# Patient Record
Sex: Female | Born: 1955 | Hispanic: Yes | Marital: Married | Smoking: Never smoker
Health system: Southern US, Community
[De-identification: ages and names within clinical notes are randomized; demographics above are authoritative.]

## PROBLEM LIST (undated history)

## (undated) DIAGNOSIS — J302 Other seasonal allergic rhinitis: Secondary | ICD-10-CM

## (undated) DIAGNOSIS — K219 Gastro-esophageal reflux disease without esophagitis: Secondary | ICD-10-CM

## (undated) DIAGNOSIS — I10 Essential (primary) hypertension: Secondary | ICD-10-CM

---

## 2017-10-23 ENCOUNTER — Emergency Department (HOSPITAL_BASED_OUTPATIENT_CLINIC_OR_DEPARTMENT_OTHER): Payer: 59

## 2017-10-23 ENCOUNTER — Encounter (HOSPITAL_BASED_OUTPATIENT_CLINIC_OR_DEPARTMENT_OTHER): Payer: Self-pay | Admitting: Emergency Medicine

## 2017-10-23 ENCOUNTER — Emergency Department (HOSPITAL_BASED_OUTPATIENT_CLINIC_OR_DEPARTMENT_OTHER)
Admission: EM | Admit: 2017-10-23 | Discharge: 2017-10-24 | Disposition: A | Discharge status: Home / Self Care | Payer: 59 | Attending: Emergency Medicine | Admitting: Emergency Medicine

## 2017-10-23 ENCOUNTER — Other Ambulatory Visit: Payer: Self-pay

## 2017-10-23 DIAGNOSIS — I10 Essential (primary) hypertension: Secondary | ICD-10-CM | POA: Diagnosis not present

## 2017-10-23 DIAGNOSIS — Y999 Unspecified external cause status: Secondary | ICD-10-CM | POA: Insufficient documentation

## 2017-10-23 DIAGNOSIS — Z91040 Latex allergy status: Secondary | ICD-10-CM | POA: Insufficient documentation

## 2017-10-23 DIAGNOSIS — W010XXA Fall on same level from slipping, tripping and stumbling without subsequent striking against object, initial encounter: Secondary | ICD-10-CM | POA: Diagnosis not present

## 2017-10-23 DIAGNOSIS — S8002XA Contusion of left knee, initial encounter: Secondary | ICD-10-CM | POA: Diagnosis not present

## 2017-10-23 DIAGNOSIS — S80211A Abrasion, right knee, initial encounter: Secondary | ICD-10-CM | POA: Diagnosis not present

## 2017-10-23 DIAGNOSIS — S8001XA Contusion of right knee, initial encounter: Secondary | ICD-10-CM | POA: Diagnosis not present

## 2017-10-23 DIAGNOSIS — S62617A Displaced fracture of proximal phalanx of left little finger, initial encounter for closed fracture: Secondary | ICD-10-CM | POA: Diagnosis not present

## 2017-10-23 DIAGNOSIS — Y92481 Parking lot as the place of occurrence of the external cause: Secondary | ICD-10-CM | POA: Insufficient documentation

## 2017-10-23 DIAGNOSIS — S6992XA Unspecified injury of left wrist, hand and finger(s), initial encounter: Secondary | ICD-10-CM | POA: Diagnosis present

## 2017-10-23 DIAGNOSIS — Y9301 Activity, walking, marching and hiking: Secondary | ICD-10-CM | POA: Diagnosis not present

## 2017-10-23 DIAGNOSIS — Z23 Encounter for immunization: Secondary | ICD-10-CM | POA: Diagnosis not present

## 2017-10-23 DIAGNOSIS — S60419A Abrasion of unspecified finger, initial encounter: Secondary | ICD-10-CM

## 2017-10-23 DIAGNOSIS — S60417A Abrasion of left little finger, initial encounter: Secondary | ICD-10-CM | POA: Diagnosis not present

## 2017-10-23 DIAGNOSIS — S62619A Displaced fracture of proximal phalanx of unspecified finger, initial encounter for closed fracture: Secondary | ICD-10-CM

## 2017-10-23 DIAGNOSIS — Z79899 Other long term (current) drug therapy: Secondary | ICD-10-CM | POA: Insufficient documentation

## 2017-10-23 DIAGNOSIS — S80212A Abrasion, left knee, initial encounter: Secondary | ICD-10-CM

## 2017-10-23 HISTORY — DX: Gastro-esophageal reflux disease without esophagitis: K21.9

## 2017-10-23 HISTORY — DX: Essential (primary) hypertension: I10

## 2017-10-23 HISTORY — DX: Other seasonal allergic rhinitis: J30.2

## 2017-10-23 MED ORDER — TETANUS-DIPHTH-ACELL PERTUSSIS 5-2.5-18.5 LF-MCG/0.5 IM SUSP
0.5000 mL | Freq: Once | INTRAMUSCULAR | Status: AC
  Administered 2017-10-23: 0.5 mL via INTRAMUSCULAR
  Filled 2017-10-23: qty 0.5

## 2017-10-23 MED ORDER — NAPROXEN 250 MG PO TABS
500.0000 mg | ORAL_TABLET | Freq: Once | ORAL | Status: AC
  Administered 2017-10-23: 500 mg via ORAL
  Filled 2017-10-23: qty 2

## 2017-10-23 NOTE — ED Provider Notes (Signed)
[facility] Provider Note: Lowella Dell, MD, FACEP  CSN: [id number] MRN: [id number] ARRIVAL: 10/23/17 at 2025 ROOM: [facility]   CHIEF COMPLAINT  Fall   HISTORY OF PRESENT ILLNESS  10/23/17 11:39 PM Sara Cobb is a 62 y.o. female tripped and fell in a parking lot about 8 PM.  She landed on her knees as well as her left outstretched hand.  She is complaining of pain in her anterior knees bilaterally and pain in her left fifth finger.  There is an abrasion and ecchymosis to the right knee.  There is an abrasion proximal to the MCP joint of the left fifth finger.  She rates her pain as a 6 out of 10, worse with movement or palpation.  She is able to bear weight.  She is having some paresthesias of the left fifth finger.  She is also having some lesser pain in her left shoulder which has come on gradually.  She denies neck pain or head injury.  She has not taken anything for her pain.   Past Medical History:  Diagnosis Date  . GERD (gastroesophageal reflux disease)   . Hypertension   . Seasonal allergies     History reviewed. No pertinent surgical history.  History reviewed. No pertinent family history.  Social History   Tobacco Use  . Smoking status: Never Smoker  . Smokeless tobacco: Never Used  Substance Use Topics  . Alcohol use: No    Frequency: Never  . Drug use: No    Prior to Admission medications   Medication Sig Start Date End Date Taking? Authorizing Provider  fluticasone (FLONASE) 50 MCG/ACT nasal spray Place into both nostrils daily.   Yes [provider]  hydrochlorothiazide (MICROZIDE) 12.5 MG capsule Take 12.5 mg by mouth daily.   Yes [provider]  levocetirizine (XYZAL) 5 MG tablet Take 5 mg by mouth every evening.   Yes [provider]  omeprazole (PRILOSEC) 20 MG capsule Take 20 mg by mouth daily.   Yes [provider]    Allergies Erythromycin; Biaxin [clarithromycin]; Codeine; Latex; and Benadryl  [diphenhydramine]   REVIEW OF SYSTEMS  Negative except as noted here or in the History of Present Illness.   PHYSICAL EXAMINATION  Initial Vital Signs Blood pressure (!) 149/58, pulse 73, temperature 99.3 F (37.4 C), temperature source Oral, resp. rate 18, height 5\' 4"  (1.626 m), weight 97.5 kg (215 lb), SpO2 100 %.  Examination General: Well-developed, well-nourished female in no acute distress; appearance consistent with age of record HENT: normocephalic; atraumatic Eyes: pupils equal, round and reactive to light; extraocular muscles intact Neck: supple; no C-spine tenderness Heart: regular rate and rhythm Lungs: clear to auscultation bilaterally Abdomen: soft; nondistended; nontender; bowel sounds present Extremities: No deformity; full range of motion except right knee and left fifth finger; pulses normal; tenderness proximal left fifth finger with abrasion proximal to the MCP joint, brisk capillary refill distally; superficial abrasions to bilateral knees with infrapatellar swelling and ecchymosis on the right Neurologic: Awake, alert and oriented; motor function intact in all extremities and symmetric; no facial droop; altered sensation of distal left fifth finger without complete anesthesia Skin: Warm and dry Psychiatric: Normal mood and affect   RESULTS  Summary of this visit's results, reviewed by myself:   EKG Interpretation  Date/Time:    Ventricular Rate:    PR Interval:    QRS Duration:   QT Interval:    QTC Calculation:   R Axis:  Text Interpretation:        Laboratory Studies: No results found for this or any previous visit (from the past 24 hour(s)). Imaging Studies: Dg Knee Complete 4 Views Left  Result Date: 10/23/2017 CLINICAL DATA:  BILATERAL knee and LEFT hand pain, fell in parking lot tonight, tripped over a concrete parking block, some swelling in RIGHT knee, LEFT hand pain greatest at little finger, initial encounter EXAM: LEFT KNEE - COMPLETE  4+ VIEW COMPARISON:  None FINDINGS: Osseous demineralization. Joint spaces preserved. No acute fracture, dislocation, or bone destruction. No knee joint effusion. IMPRESSION: No acute osseous abnormalities. Electronically Signed   By: Ulyses SouthwardMark  Boles M.D.   On: 10/23/2017 21:15   Dg Knee Complete 4 Views Right  Result Date: 10/23/2017 CLINICAL DATA:  BILATERAL knee and LEFT hand pain, fell in parking lot tonight, tripped over a concrete curb, some swelling in RIGHT knee, LEFT hand pain greatest at little finger, initial encounter EXAM: RIGHT KNEE - COMPLETE 4+ VIEW COMPARISON:  None FINDINGS: Osseous demineralization. Joint spaces preserved. No acute fracture, dislocation, or bone destruction. No knee joint effusion. Anterior infrapatellar soft tissue swelling. IMPRESSION: Anterior soft tissue swelling without acute bony abnormalities. Electronically Signed   By: Ulyses SouthwardMark  Boles M.D.   On: 10/23/2017 21:16   Dg Hand Complete Left  Result Date: 10/23/2017 CLINICAL DATA:  BILATERAL knee and LEFT hand pain, fell in parking lot tonight, tripped over a concrete curb, some swelling in RIGHT knee, LEFT hand pain greatest at little finger, initial encounter EXAM: LEFT HAND - COMPLETE 3+ VIEW COMPARISON:  None FINDINGS: Osseous demineralization. Joint spaces preserved. Transverse minimally displaced fracture at base of proximal phalanx LEFT little finger. No definite articular extension. No additional fracture, dislocation, or bone destruction. IMPRESSION: Minimally displaced transverse fracture at base of proximal phalanx LEFT little finger. Electronically Signed   By: Ulyses SouthwardMark  Boles M.D.   On: 10/23/2017 21:18    ED COURSE  Nursing notes and initial vitals signs, including pulse oximetry, reviewed.  Vitals:   10/23/17 2032 10/23/17 2033  BP: (!) 149/58   Pulse: 73   Resp: 18   Temp: 99.3 F (37.4 C)   TempSrc: Oral   SpO2: 100%   Weight:  97.5 kg (215 lb)  Height:  5\' 4"  (1.626 m)   Fractured finger splinted  by technician.  PROCEDURES    ED DIAGNOSES     ICD-10-CM   1. Fall from slip, trip, or stumble, initial encounter W01.0XXA   2. Closed fracture of proximal phalanx of digit of left hand, initial encounter S62.619A   3. Abrasion of finger of left hand, initial encounter S60.419A   4. Contusion of left knee, initial encounter S80.02XA   5. Contusion of right knee, initial encounter S80.01XA   6. Abrasion of left knee, initial encounter S80.212A   7. Abrasion of right knee, initial encounter [id number]        Paula LibraMolpus, Jaxsyn Catalfamo, MD 10/24/17 0002

## 2017-10-23 NOTE — ED Notes (Signed)
Larey SeatFell in a parking lot  No loc,  C/o bilateral knee pain and left hand pain  Hand swollen w abrasion,  Rt knee swollen w abrasion,  Left knee some swelling  Ice applied to bilateral knees and left hand

## 2017-10-23 NOTE — ED Triage Notes (Signed)
Patient fell at 8 pm and hurt her right and left knees and her left hand

## 2017-10-24 MED ORDER — MELOXICAM 15 MG PO TABS: 7.5000 mg | ORAL_TABLET | Freq: Every day | ORAL | 0 refills | Status: AC | PRN

## 2019-07-24 IMAGING — DX DG HAND COMPLETE 3+V*L*
3 series · 3 of 3 positions shown · non-contrast
Comparison: None

CLINICAL DATA: BILATERAL knee and LEFT hand pain, fell in parking
lot tonight, tripped over a concrete curb, some swelling in RIGHT
knee, LEFT hand pain greatest at little finger, initial encounter

EXAM:
LEFT HAND - COMPLETE 3+ VIEW

[hand pa]
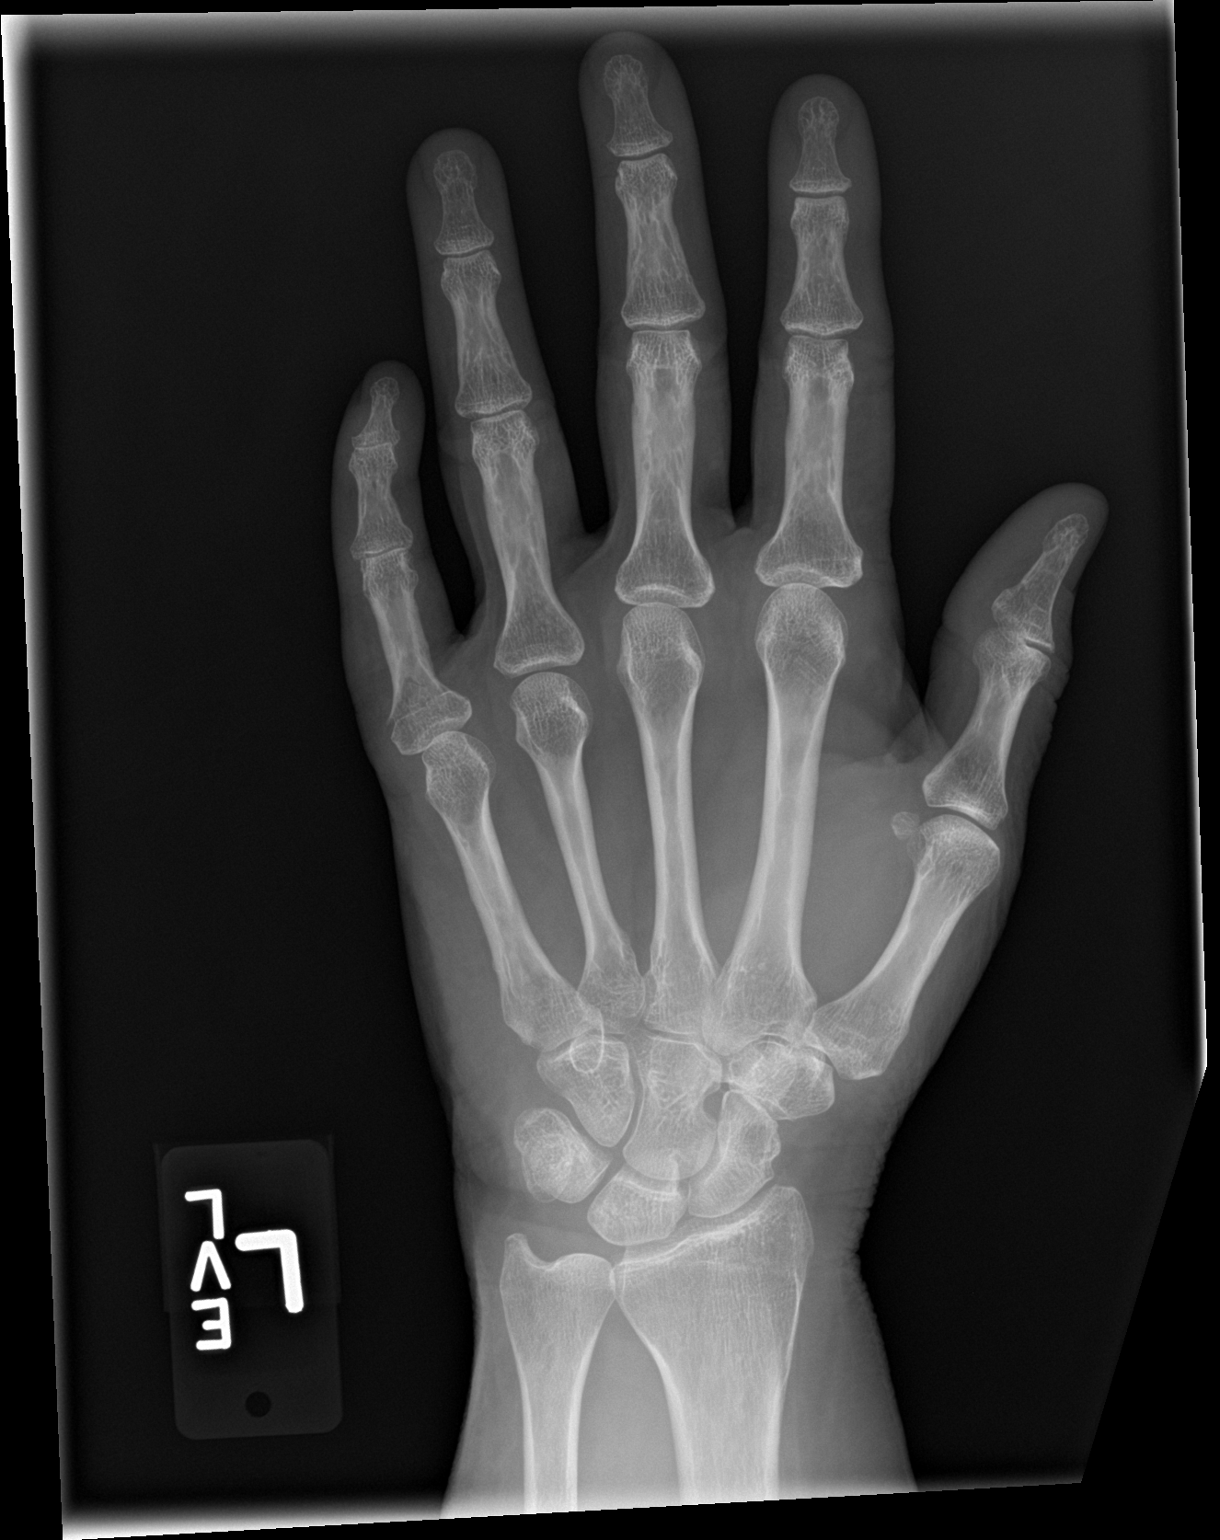

[hand obl]
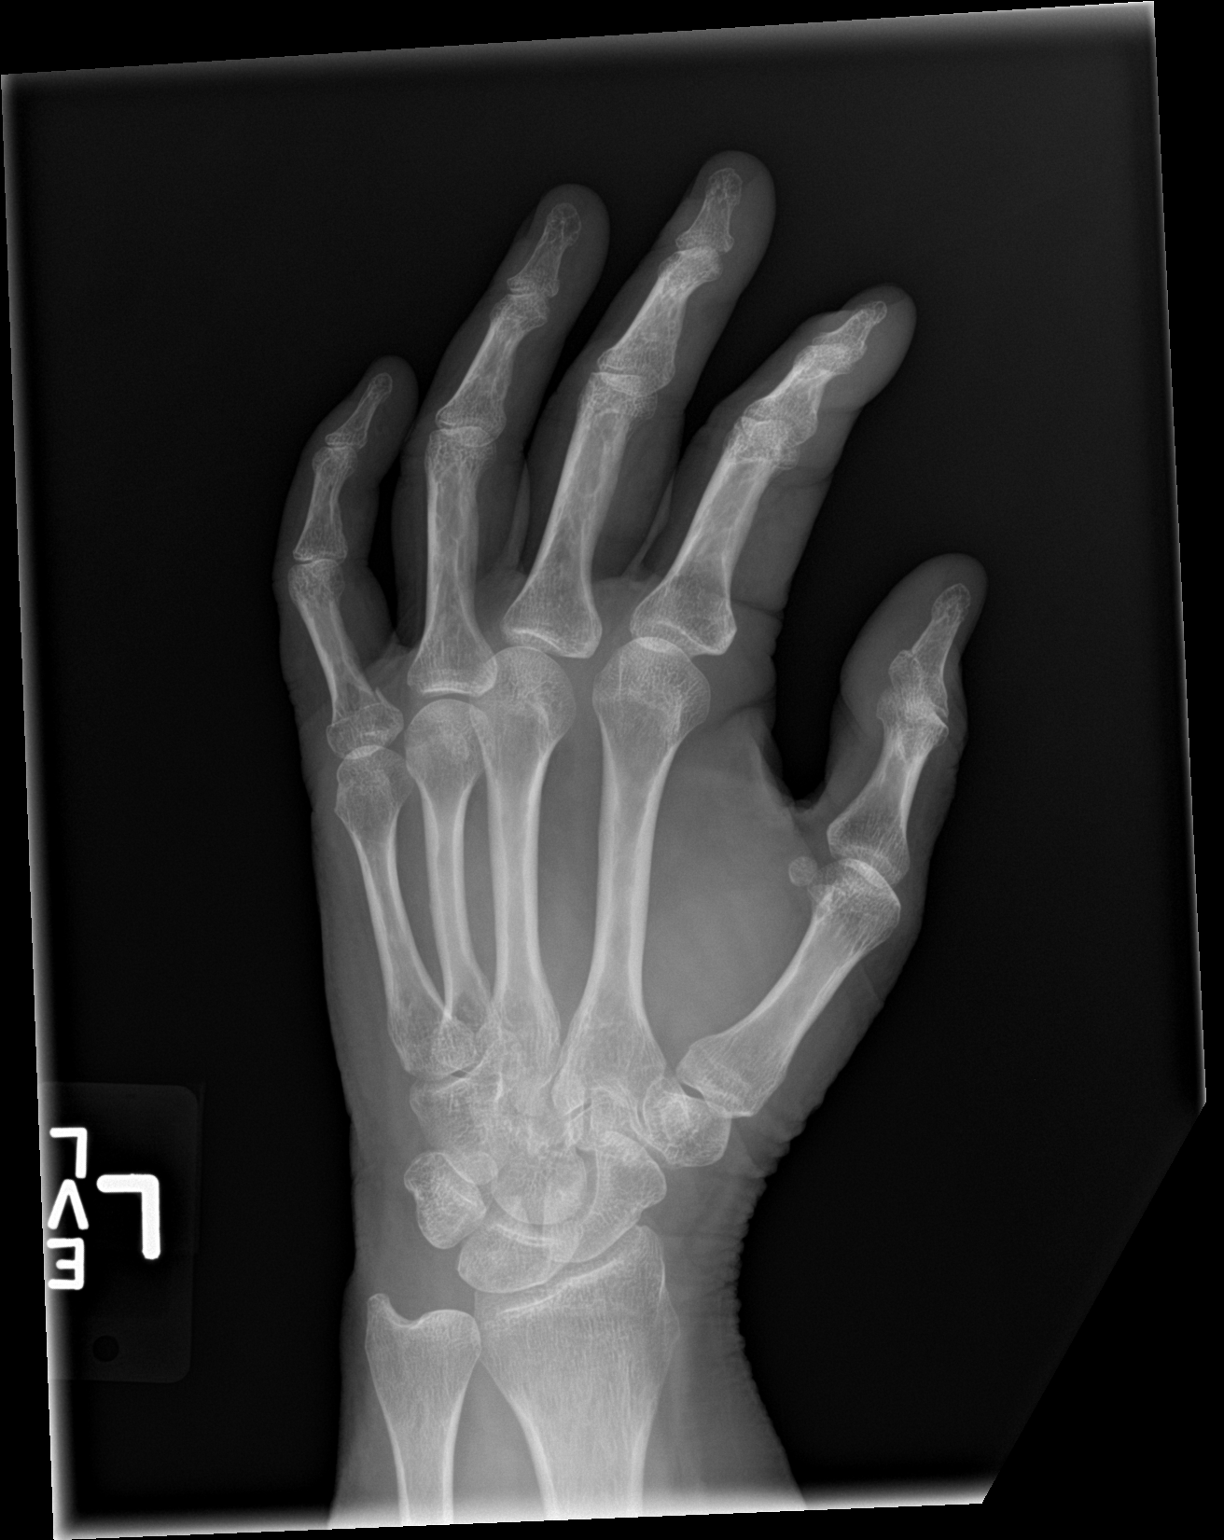

[hand lat]
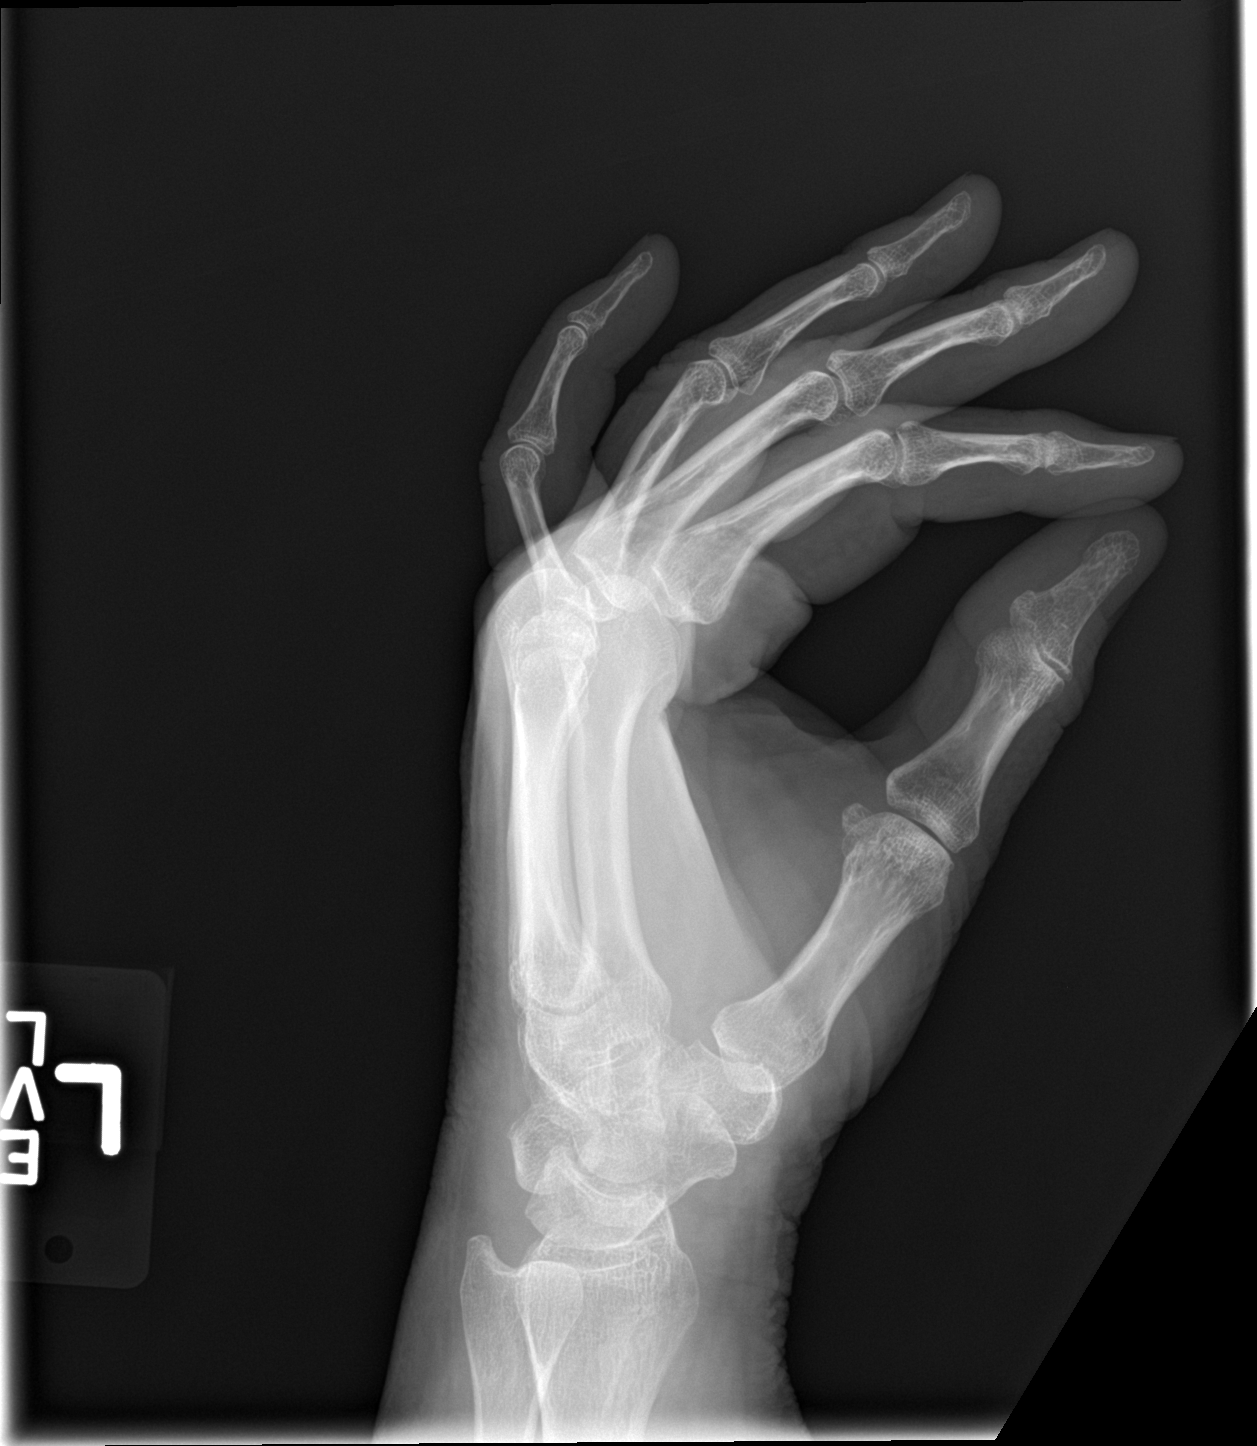

[3 of 3 positions shown; findings below may reference images not displayed]

FINDINGS: Osseous demineralization.

Joint spaces preserved.

Transverse minimally displaced fracture at base of proximal phalanx
LEFT little finger.

No definite articular extension.

No additional fracture, dislocation, or bone destruction.
IMPRESSION: Minimally displaced transverse fracture at base of proximal phalanx
LEFT little finger.

## 2019-07-24 IMAGING — DX DG KNEE COMPLETE 4+V*R*
4 series · 4 of 4 positions shown · non-contrast
Comparison: None

CLINICAL DATA: BILATERAL knee and LEFT hand pain, fell in parking
lot tonight, tripped over a concrete curb, some swelling in RIGHT
knee, LEFT hand pain greatest at little finger, initial encounter

EXAM:
RIGHT KNEE - COMPLETE 4+ VIEW

[knee ap]
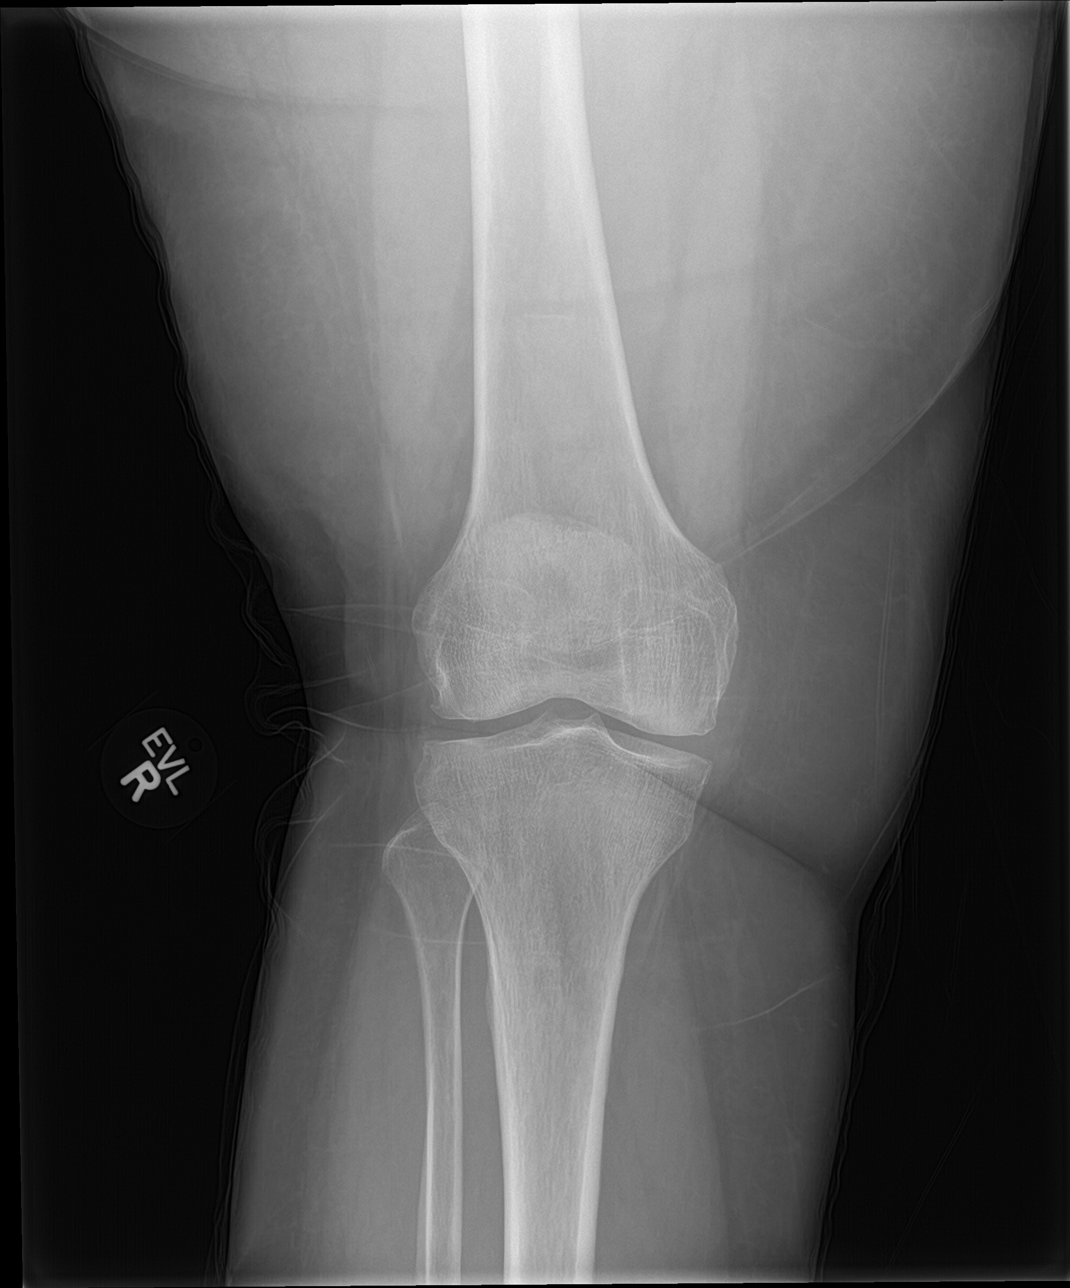

[knee lat]
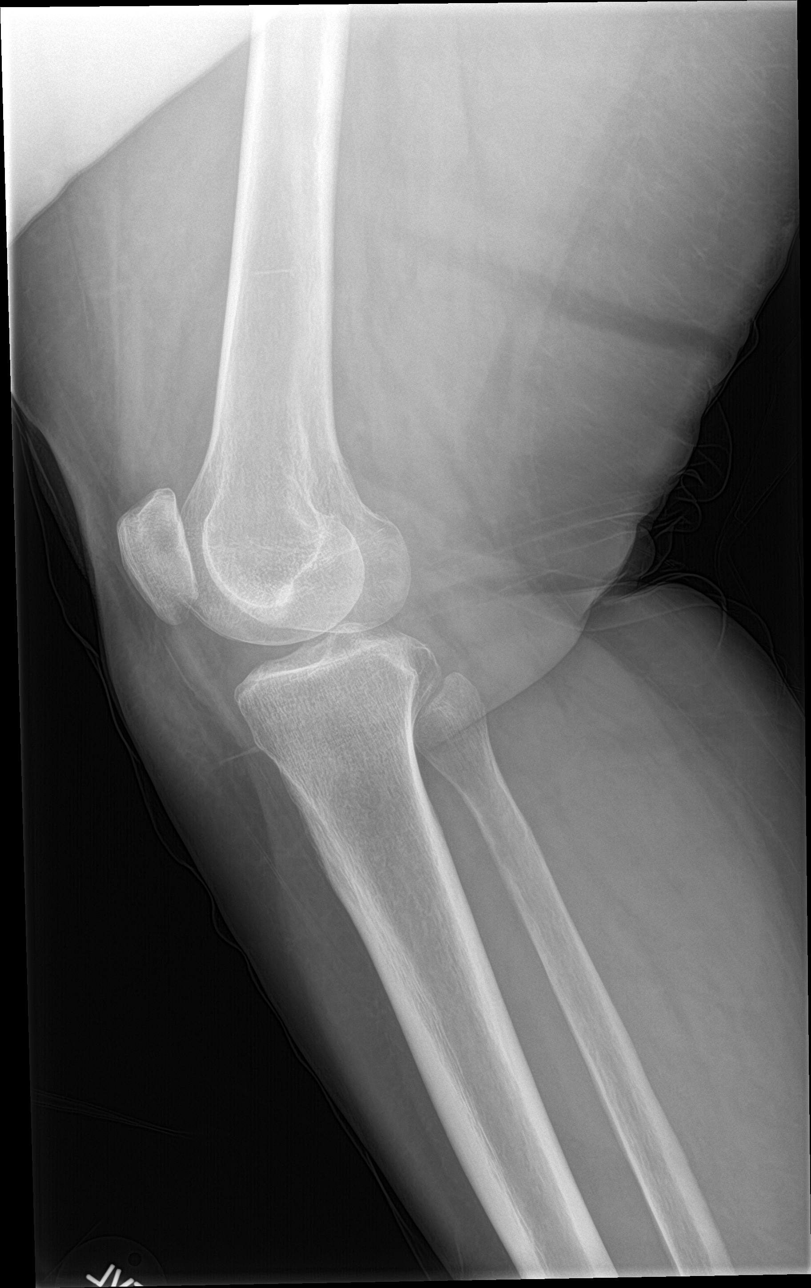

[knee obl (1 of 2)]
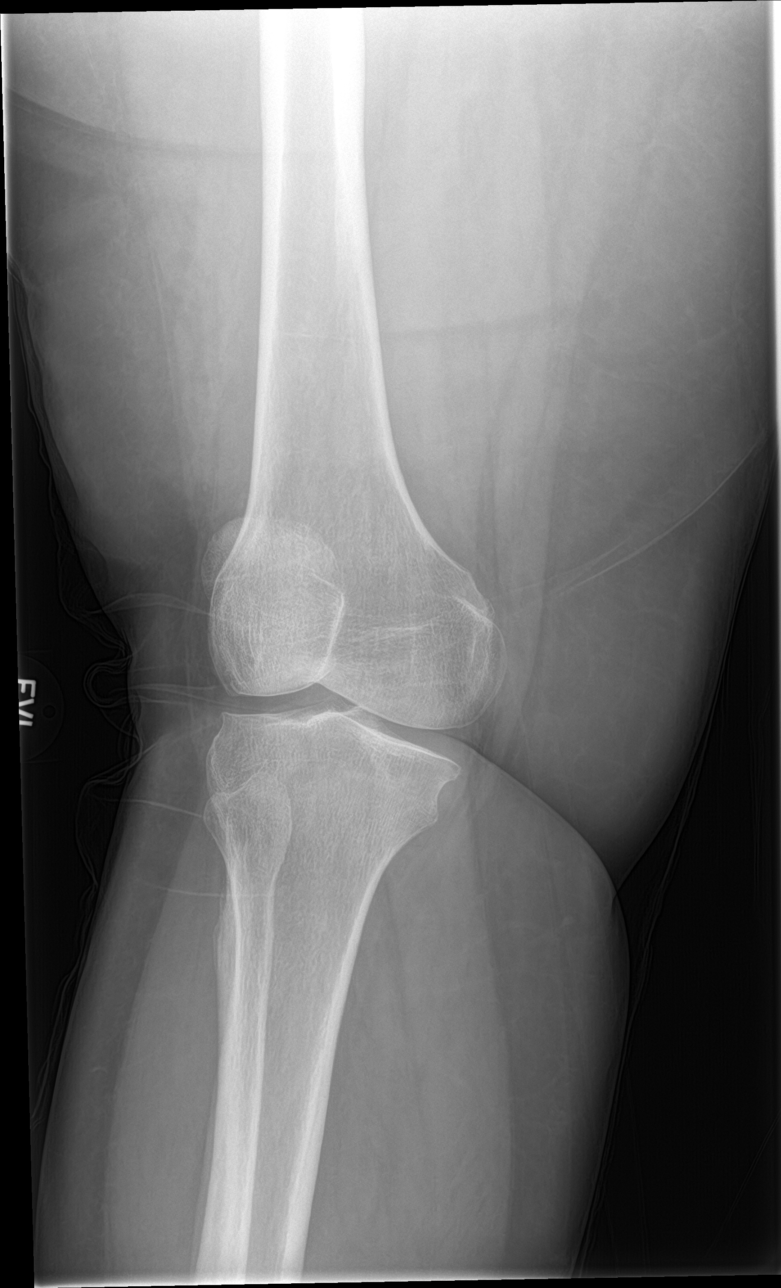

[knee obl (2 of 2)]
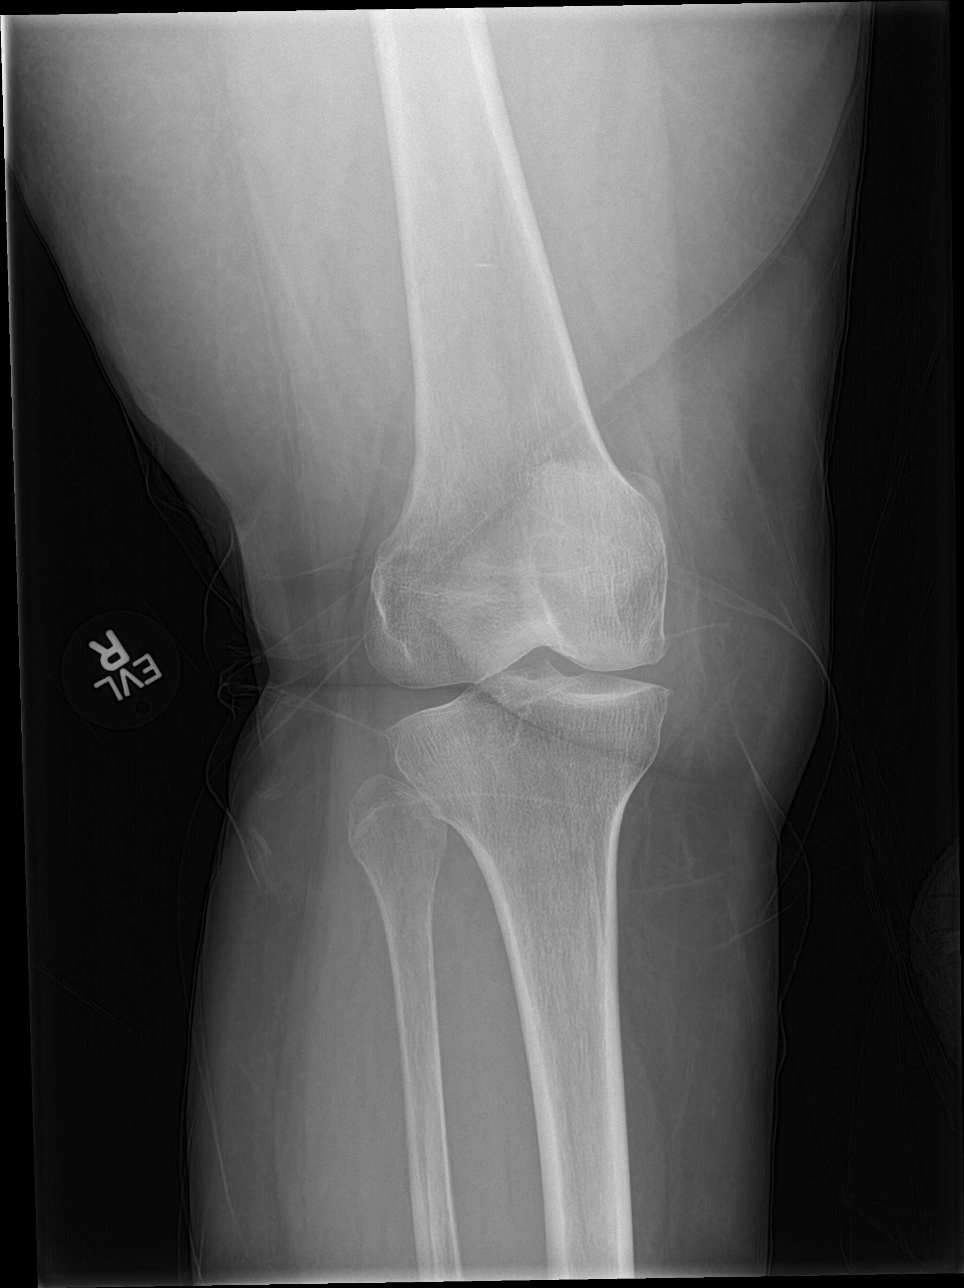

[4 of 4 positions shown; findings below may reference images not displayed]

FINDINGS: Osseous demineralization.

Joint spaces preserved.

No acute fracture, dislocation, or bone destruction.

No knee joint effusion.

Anterior infrapatellar soft tissue swelling.
IMPRESSION: Anterior soft tissue swelling without acute bony abnormalities.
